# Patient Record
Sex: Male | Born: 1970 | Race: White | Hispanic: No | Smoking: Never smoker
Health system: Southern US, Community
[De-identification: ages and names within clinical notes are randomized; demographics above are authoritative.]

---

## 2004-12-22 ENCOUNTER — Encounter: Admission: RE | Admit: 2004-12-22 | Discharge: 2004-12-22 | Payer: Self-pay | Admitting: Family Medicine

## 2006-08-16 ENCOUNTER — Emergency Department (HOSPITAL_COMMUNITY): Admission: EM | Admit: 2006-08-16 | Discharge: 2006-08-16 | Payer: Self-pay | Admitting: Emergency Medicine

## 2006-09-26 IMAGING — CR DG ORBITS FOR FOREIGN BODY
2 series · 2 of 2 positions shown · non-contrast
Comparison: none

CLINICAL DATA: History of orbital plates, clearance prior to MRI

ORBITS FOR FOREIGN BODY - 2 VIEW:

[view not recorded (1 of 2)]
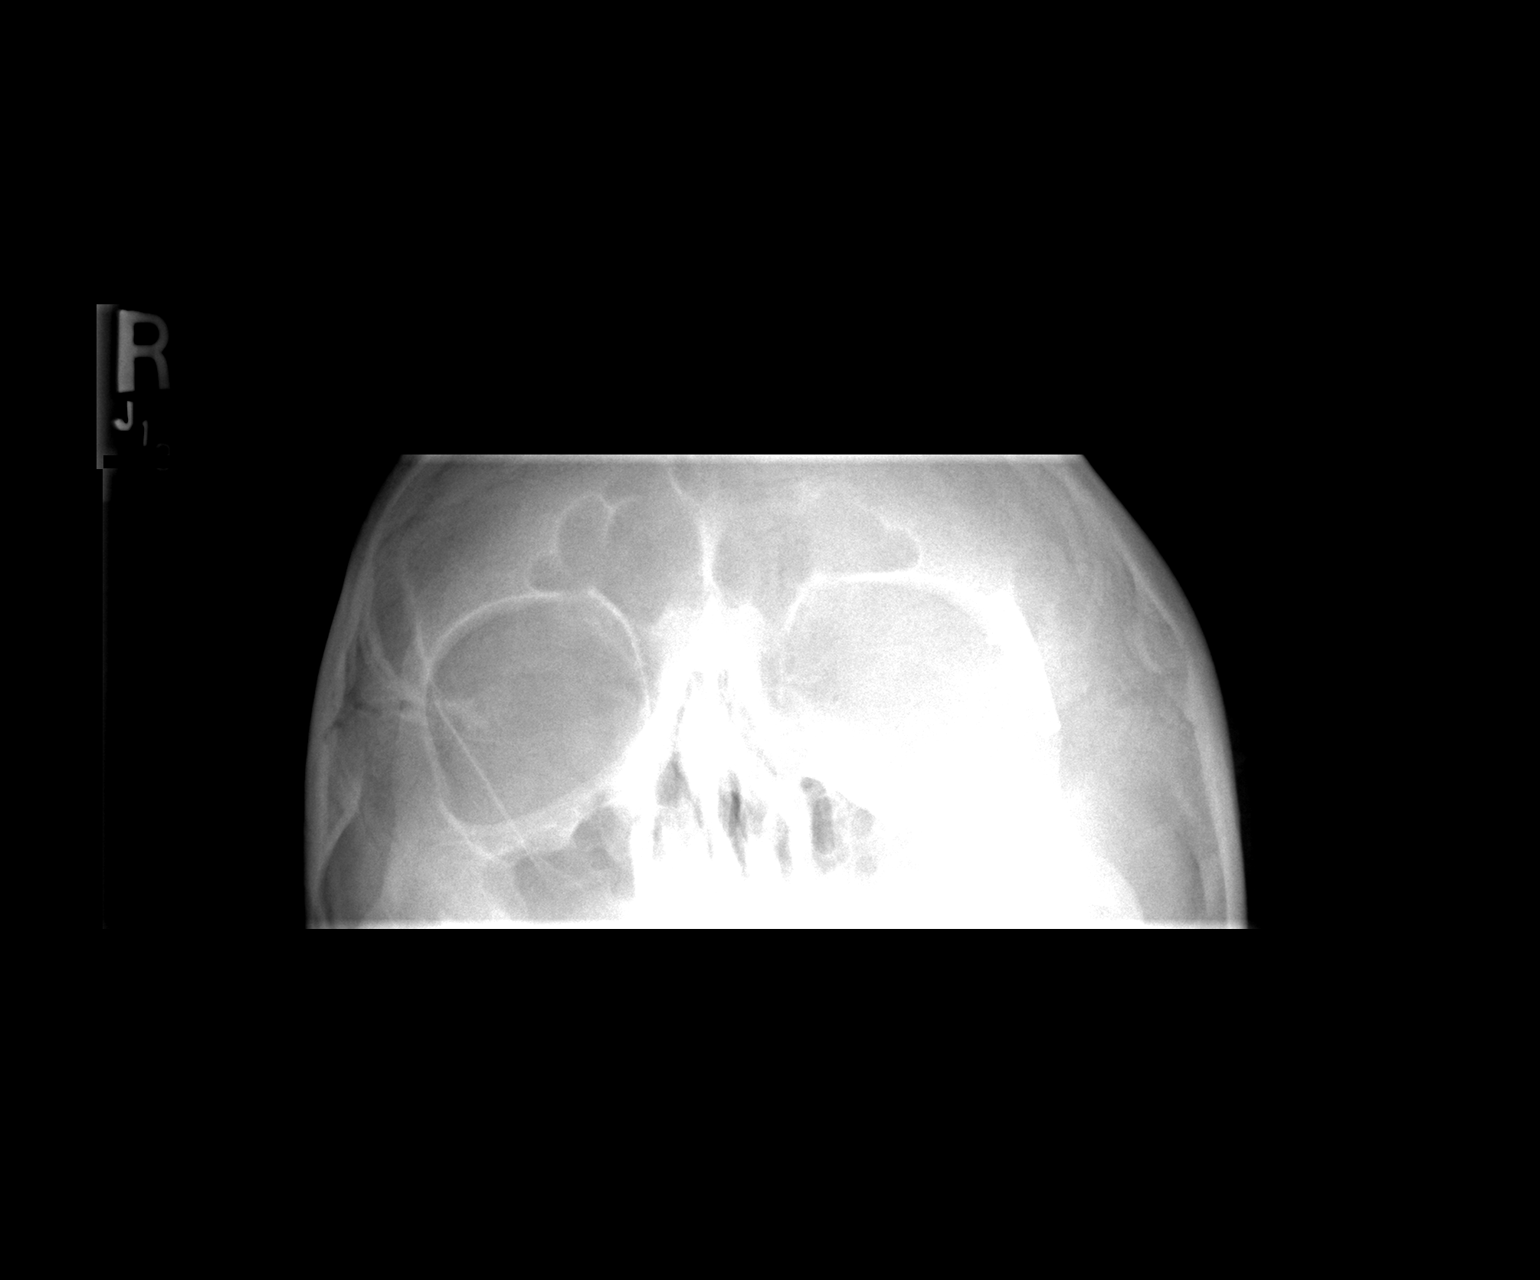

[view not recorded (2 of 2)]
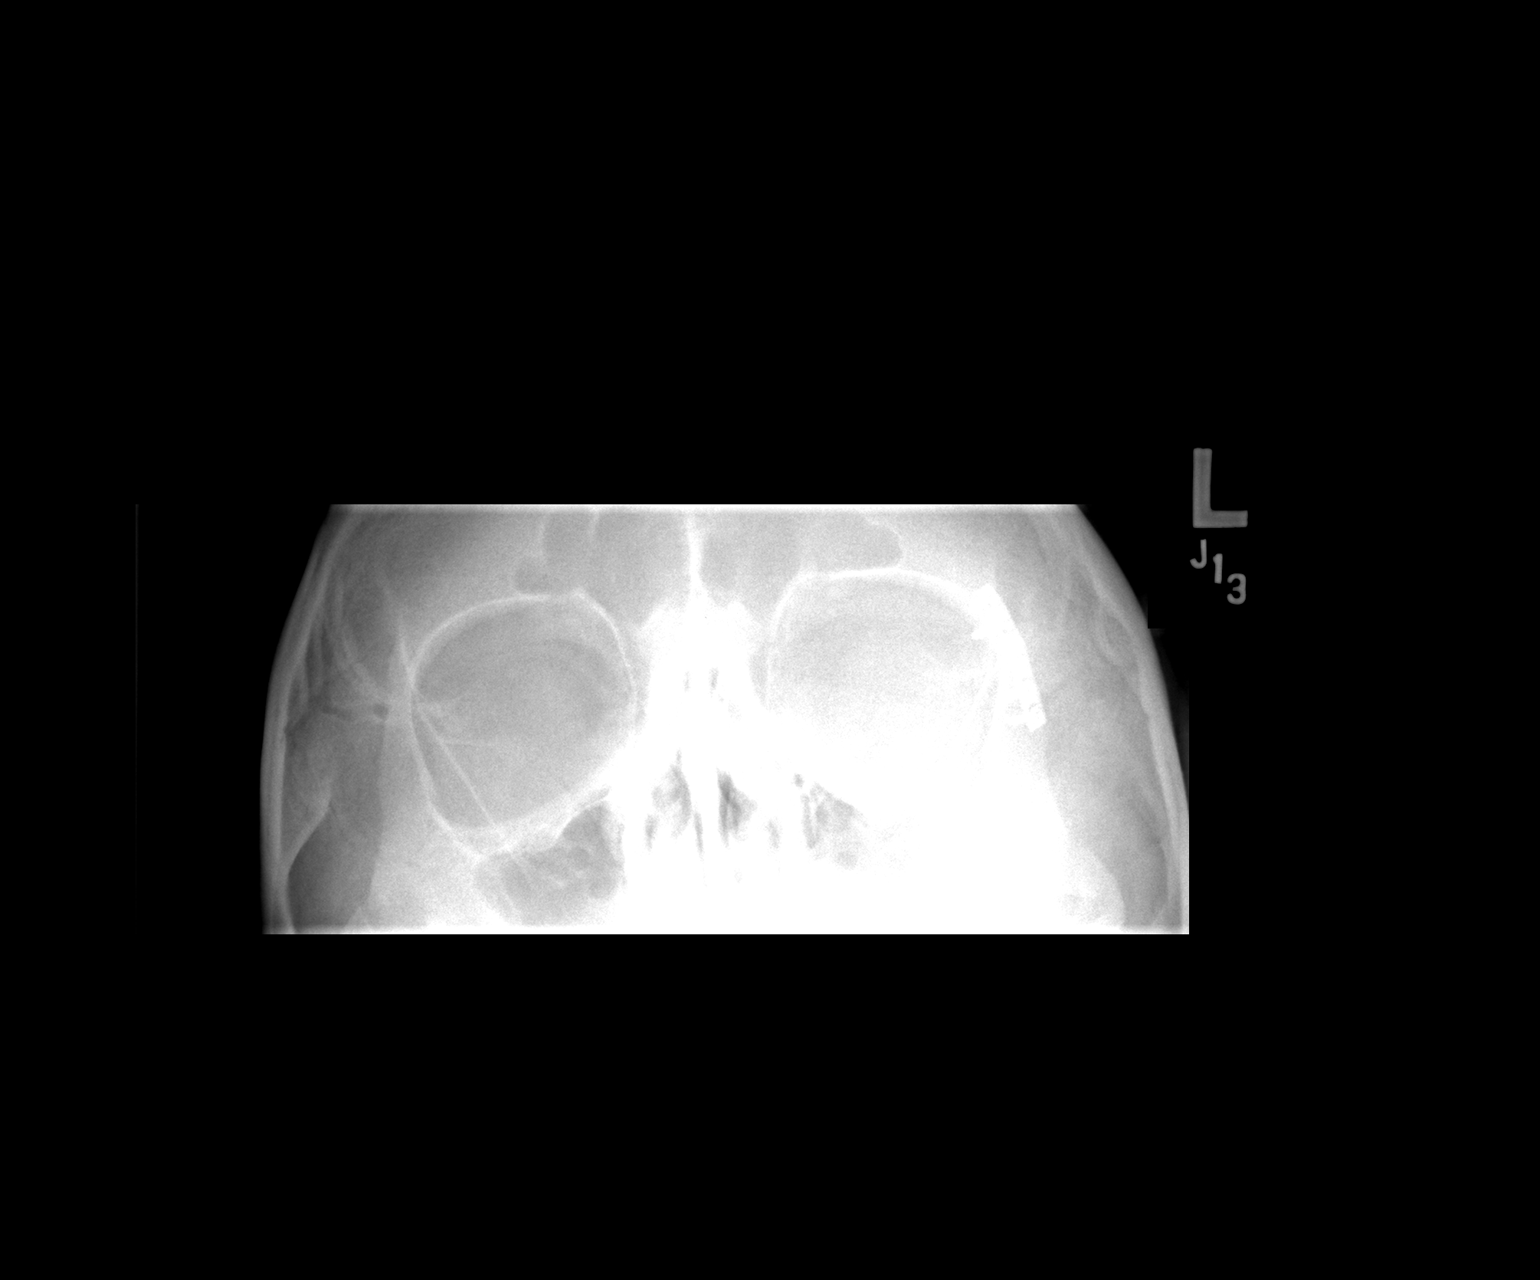

[2 of 2 positions shown; findings below may reference images not displayed]

FINDINGS: There are plate and screw fixation device is noted along the lateral
left orbit and left orbital floor. No intraorbital foreign bodies identified.
IMPRESSION: Plate/screw fixation device left lateral orbital wall and left orbital floor.

## 2014-05-21 ENCOUNTER — Encounter (INDEPENDENT_AMBULATORY_CARE_PROVIDER_SITE_OTHER): Payer: Self-pay

## 2014-05-21 ENCOUNTER — Ambulatory Visit (INDEPENDENT_AMBULATORY_CARE_PROVIDER_SITE_OTHER): Payer: 59 | Admitting: Family Medicine

## 2014-05-21 ENCOUNTER — Encounter: Payer: Self-pay | Admitting: Family Medicine

## 2014-05-21 VITALS — BP 125/87 | HR 79 | Ht 70.0 in | Wt 220.0 lb

## 2014-05-21 DIAGNOSIS — S8991XA Unspecified injury of right lower leg, initial encounter: Secondary | ICD-10-CM

## 2014-05-21 NOTE — Patient Instructions (Signed)
You sustained a mild sprain to the medial patellofemoral ligament. I would expect your pain to be completely resolved over 1-2 weeks. Focus on the straight leg raises, straight leg raises with foot turned outwards. Add ankle weight for these, do 3 sets of 10 once a day for 4-6 weeks. Consider physical therapy, knee brace, better arch support if not improving (like superfeet or dr. Jari Sportsmanscholls active series). I'd avoid deep squats, lunges, leg press, a lot of stairs, plyometrics for at least a week. Follow up with me in 4 weeks or as needed.

## 2014-05-22 DIAGNOSIS — S8991XA Unspecified injury of right lower leg, initial encounter: Secondary | ICD-10-CM | POA: Insufficient documentation

## 2014-05-22 NOTE — Assessment & Plan Note (Signed)
exam reassuring.  Suspect he mildly sprained MPFL when he turned on a platform.  Shown home exercises to do daily.  Consider physical therapy, knee brace, better arch support if not improving.  Avoid squats, lunges, leg press for at least a week.  F/u in 4 weeks or prn.

## 2014-05-22 NOTE — Progress Notes (Signed)
PCP: No primary care provider on file.  Subjective:   HPI: Patient is a 43 y.o. male here for right knee injury.  Patient reports about 2 weeks ago he did squats with weight. Felt ok after doing this but stepped and turned on a platform and felt a little pain on medial aspect of right knee. Improved and today not having pain currently. Pain worse with a lot of activity, ladder, stairs. No swelling or bruising. No catching, locking, giving out. No prior right knee injuries.  No past medical history on file.  No current outpatient prescriptions on file prior to visit.   No current facility-administered medications on file prior to visit.    No past surgical history on file.  Allergies  Allergen Reactions  . Amoxicillin     History   Social History  . Marital Status: Unknown    Spouse Name: N/A    Number of Children: N/A  . Years of Education: N/A   Occupational History  . Not on file.   Social History Main Topics  . Smoking status: Never Smoker   . Smokeless tobacco: Not on file  . Alcohol Use: Not on file  . Drug Use: Not on file  . Sexual Activity: Not on file   Other Topics Concern  . Not on file   Social History Narrative  . No narrative on file    No family history on file.  BP 125/87 mmHg  Pulse 79  Ht 5\' 10"  (1.778 m)  Wt 220 lb (99.791 kg)  BMI 31.57 kg/m2  Review of Systems: See HPI above.    Objective:  Physical Exam:  Gen: NAD  Right knee: No gross deformity, ecchymoses, swelling. Minimal TTP medially over MPFL.  No other tenderness. FROM. Negative ant/post drawers. Negative valgus/varus testing. Negative lachmanns. Negative mcmurrays, apleys, patellar apprehension. NV intact distally.    Assessment & Plan:  1. Right knee injury - exam reassuring.  Suspect he mildly sprained MPFL when he turned on a platform.  Shown home exercises to do daily.  Consider physical therapy, knee brace, better arch support if not improving.  Avoid  squats, lunges, leg press for at least a week.  F/u in 4 weeks or prn.

## 2022-11-15 ENCOUNTER — Other Ambulatory Visit: Payer: Self-pay | Admitting: Orthopedic Surgery

## 2022-11-15 DIAGNOSIS — Q799 Congenital malformation of musculoskeletal system, unspecified: Secondary | ICD-10-CM

## 2022-12-14 ENCOUNTER — Other Ambulatory Visit: Payer: 59
